# Patient Record
Sex: Female | Born: 1995 | Race: Black or African American | Hispanic: No | Marital: Single | Smoking: Never smoker
Health system: Southern US, Community
[De-identification: ages and names within clinical notes are randomized; demographics above are authoritative.]

---
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
## 2016-03-13 ENCOUNTER — Emergency Department (HOSPITAL_COMMUNITY)
Admission: EM | Admit: 2016-03-13 | Discharge: 2016-03-13 | Disposition: A | Discharge status: Home / Self Care | Payer: Self-pay | Attending: Emergency Medicine | Admitting: Emergency Medicine

## 2016-03-13 ENCOUNTER — Encounter (HOSPITAL_COMMUNITY): Payer: Self-pay | Admitting: Emergency Medicine

## 2016-03-13 DIAGNOSIS — B349 Viral infection, unspecified: Secondary | ICD-10-CM | POA: Insufficient documentation

## 2016-03-13 DIAGNOSIS — Z79899 Other long term (current) drug therapy: Secondary | ICD-10-CM | POA: Insufficient documentation

## 2016-03-13 LAB — RAPID STREP SCREEN (MED CTR MEBANE ONLY): Streptococcus, Group A Screen (Direct): NEGATIVE

## 2016-03-13 MED ORDER — OSELTAMIVIR PHOSPHATE 75 MG PO CAPS: 75.0000 mg | ORAL_CAPSULE | Freq: Two times a day (BID) | ORAL | 0 refills | Status: DC

## 2016-03-13 MED ORDER — BENZONATATE 100 MG PO CAPS: 200.0000 mg | ORAL_CAPSULE | Freq: Two times a day (BID) | ORAL | 0 refills | Status: DC | PRN

## 2016-03-13 MED ORDER — OXYMETAZOLINE HCL 0.05 % NA SOLN: 1.0000 | Freq: Two times a day (BID) | NASAL | 0 refills | Status: DC

## 2016-03-13 NOTE — ED Triage Notes (Signed)
Pt reports being at work and having body ache, chills, fever, and sore throat. Pt reports taking 2 Motrin at appx 2000 tonight.

## 2016-03-13 NOTE — Discharge Instructions (Signed)
Take your medications as prescribed. I also recommend taking Tylenol and ibuprofen as prescribed over-the-counter, alternating between doses every 3-4 hours to help with your chills, body aches and fever. Continue drinking fluids at home to remain hydrated. Please follow up with a primary care provider from the Resource Guide provided below in 3-4 days if your symptoms have not improved. Please return to the Emergency Department if symptoms worsen or new onset of headache, neck stiffness, facial swelling, unable to swallow resulting in drooling, unable to open her jaw completely, coughing up blood, difficulty breathing, chest pain, abdominal pain, vomiting, unable to keep fluids down.

## 2016-03-13 NOTE — ED Provider Notes (Signed)
[facility]EMER[facility]ENC[facility] DE[facility] Provider Note   CSN: [id number] Arrival date & time: 03/13/16  2101     History   Chief Complaint Chief Complaint  Patient presents with  . Sore Throat    HPI Erin Gray is a 21 y.o. female.  HPI   Patient is a 21 year old female who presents to the ED with complaint of chills, body ache, nasal congestion, sore throat and cough, onset this morning. Patient reports having gradually worsening symptoms throughout the day. 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 Patient reports subjective fever but denies taking her temperature. She states she took Motrin around 8 PM this evening but denies taking any other medications. Patient notes her father was sick with similar symptoms last week. Denies headache, lightheaded, ear pain, trismus, drooling, hemoptysis, shortness of breath, wheezing, chest pain, abdominal pain, nausea, vomiting, diarrhea, rash.  History reviewed. No pertinent past medical history.  There are no active problems to display for this patient.   History reviewed. No pertinent surgical history.  OB History    No data available       Home Medications    Prior to Admission medications   Medication Sig Start Date End Date Taking? Authorizing Provider  antipyrine-benzocaine Lyla Son(AURALGAN) otic solution Place 3 drops into the right ear every 2 (two) hours as needed for pain. 07/05/12   Hayden Rasmussenavid Mabe, NP  benzonatate (TESSALON) 100 MG capsule Take 2 capsules (200 mg total) by mouth 2 (two) times daily as needed for cough. 03/13/16   Barrett HenleNicole Elizabeth Francesca Strome, PA-C  oseltamivir (TAMIFLU) 75 MG capsule Take 1 capsule (75 mg total) by mouth every 12 (twelve) hours. 03/13/16   Barrett HenleNicole Elizabeth Adeola Dennen, PA-C  oxymetazoline (AFRIN NASAL SPRAY) 0.05 % nasal spray Place 1 spray into both nostrils 2 (two) times daily. Spray once into each nostril twice daily for up to the next 3 days. Do  not use for more than 3 days to prevent rebound rhinorrhea. 03/13/16   Barrett HenleNicole Elizabeth Ozzie Remmers, PA-C  Prenatal Vit-Fe Fumarate-FA (PRENATAL VITAMINS) 28-0.8 MG TABS 1 po q d 08/29/13   Hayden Rasmussenavid Mabe, NP    Family History History reviewed. No pertinent family history.  Social History Social History  Substance Use Topics  . Smoking status: Never Smoker  . Smokeless tobacco: Never Used  . Alcohol use No     Allergies   Patient has no known allergies.   Review of Systems Review of Systems  Constitutional: Positive for chills and fever (subjective).  HENT: Positive for congestion and sinus pain.   Respiratory: Positive for cough.   Musculoskeletal: Positive for myalgias (generalized).  All other systems reviewed and are negative.    Physical Exam Updated Vital Signs BP 122/76 (BP Location: Left Arm)   Pulse 115   Temp 99.9 F (37.7 C) (Oral)   Resp 18   Ht 5\' 4"  (1.626 m)   Wt 46.7 kg   LMP 02/23/2016   SpO2 96%   BMI 17.68 kg/m   Physical Exam  Constitutional: She is oriented to person, place, and time. She appears well-developed and well-nourished. No distress.  HENT:  Head: Normocephalic and atraumatic.  Right Ear: Tympanic membrane normal. No mastoid tenderness. No middle ear effusion.  Left Ear: Tympanic membrane normal. No mastoid tenderness.  No middle ear effusion.  Nose: Rhinorrhea present. Right sinus exhibits no maxillary sinus tenderness and no frontal sinus tenderness. Left sinus exhibits no  maxillary sinus tenderness and no frontal sinus tenderness.  Mouth/Throat: Uvula is midline, oropharynx is clear and moist and mucous membranes are normal. No oropharyngeal exudate, posterior oropharyngeal edema, posterior oropharyngeal erythema or tonsillar abscesses. No tonsillar exudate.  Eyes: Conjunctivae and EOM are normal. Right eye exhibits no discharge. Left eye exhibits no discharge. No scleral icterus.  Neck: Normal range of motion. Neck supple.  Cardiovascular:  Normal rate, regular rhythm, normal heart sounds and intact distal pulses.   HR 92  Pulmonary/Chest: Effort normal and breath sounds normal. No respiratory distress. She has no wheezes. She has no rales. She exhibits no tenderness.  Abdominal: Soft. She exhibits no distension.  Musculoskeletal: Normal range of motion. She exhibits no edema.  Lymphadenopathy:    She has no cervical adenopathy.  Neurological: She is alert and oriented to person, place, and time.  Skin: Skin is warm and dry. She is not diaphoretic.  Nursing note and vitals reviewed.    ED Treatments / Results  Labs (all labs ordered are listed, but only abnormal results are displayed) Labs Reviewed  RAPID STREP SCREEN (NOT AT [facility])  CULTURE, GROUP A STREP [facility])    EKG  EKG Interpretation None       Radiology No results found.  Procedures Procedures (including critical care time)  Medications Ordered in ED Medications - No data to display   Initial Impression / Assessment and Plan / ED Course  I have reviewed the triage vital signs and the nursing notes.  Pertinent labs & imaging results that were available during my care of the patient were reviewed by me and considered in my medical decision making (see chart for details).  Clinical Course    Patient presents with chills, body aches, sore throat, nasal congestion, cough that started this morning. Patient reports her father was sick with similar symptoms last week. VSS, temp 99.5, HR 92. Exam unremarkable. Lungs clear to auscultation bilaterally. Strep negative.  Patient with symptoms consistent with influenza vs viral illness.  No signs of dehydration, tolerating PO's. Due to patient's presentation and physical exam a chest x-ray was not ordered bc likely diagnosis of flu.  Discussed the cost versus benefit of Tamiflu treatment with the patient.   plan to discharge patient home with prescription of Tamiflu due to symptoms starting within the past 24  hours.  Patient will be discharged with instructions to orally hydrate, rest, and use over-the-counter medications such as anti-inflammatories ibuprofen and Aleve for muscle aches and Tylenol for fever.  Patient will also be given a cough suppressant and decongestant. Advised patient to follow up with PCP as needed. Discussed return precautions.   Final Clinical Impressions(s) / ED Diagnoses   Final diagnoses:  Viral illness    New Prescriptions New Prescriptions   BENZONATATE (TESSALON) 100 MG CAPSULE    Take 2 capsules (200 mg total) by mouth 2 (two) times daily as needed for cough.   OSELTAMIVIR (TAMIFLU) 75 MG CAPSULE    Take 1 capsule (75 mg total) by mouth every 12 (twelve) hours.   OXYMETAZOLINE (AFRIN NASAL SPRAY) 0.05 % NASAL SPRAY    Place 1 spray into both nostrils 2 (two) times daily. Spray once into each nostril twice daily for up to the next 3 days. Do not use for more than 3 days to prevent rebound rhinorrhea.          Satira Sark Brogan, [place] 03/13/16 2323    Tilden Fossa, MD 03/14/16 1538

## 2016-03-16 LAB — CULTURE, GROUP A STREP (THRC)

## 2016-09-15 ENCOUNTER — Ambulatory Visit (HOSPITAL_COMMUNITY)
Admission: EM | Admit: 2016-09-15 | Discharge: 2016-09-15 | Disposition: A | Discharge status: Home / Self Care | Payer: Self-pay | Attending: Internal Medicine | Admitting: Internal Medicine

## 2016-09-15 ENCOUNTER — Encounter (HOSPITAL_COMMUNITY): Payer: Self-pay | Admitting: Emergency Medicine

## 2016-09-15 DIAGNOSIS — R300 Dysuria: Secondary | ICD-10-CM

## 2016-09-15 DIAGNOSIS — N390 Urinary tract infection, site not specified: Secondary | ICD-10-CM

## 2016-09-15 MED ORDER — CEPHALEXIN 500 MG PO CAPS: 500.0000 mg | ORAL_CAPSULE | Freq: Four times a day (QID) | ORAL | 0 refills | Status: DC

## 2016-09-15 NOTE — ED Triage Notes (Signed)
[facility] a complaint of urinary frequency and dysuria x 2 weeks.

## 2016-09-15 NOTE — Discharge Instructions (Signed)
Drink plenty of fluids stay well-hydrated. Take the medication as directed. The medication for urinary tract pain is called Azo standard. Return to urine reddish orange and helps with discomfort of urination.

## 2016-09-15 NOTE — ED Provider Notes (Signed)
   CSN: [id number]     Arrival date & time 09/15/16  1725 History   First MD Initiated Contact with Patient 09/15/16 1826     Chief Complaint  Patient presents with  . Dysuria   (Consider location/radiation/quality/duration/timing/severity/associated sxs/prior Treatment) 21 year old female complaining of dysuria, urinary frequency and urgency for 2 weeks. Denies pelvic pain.      History reviewed. No pertinent past medical history. History reviewed. No pertinent surgical history. History reviewed. No pertinent family history. Social History  Substance Use Topics  . Smoking status: Never Smoker  . Smokeless tobacco: Never Used  . Alcohol use No   OB History    No data available     Review of Systems  Constitutional: Negative.   Respiratory: Negative.   Gastrointestinal: Negative.   Genitourinary: Negative for flank pain, menstrual problem and vaginal bleeding.       As per history of present illness  Skin: Negative.   All other systems reviewed and are negative.   Allergies  Patient has no known allergies.  Home Medications   Prior to Admission medications   Medication Sig Start Date End Date Taking? Authorizing Provider  cephALEXin (KEFLEX) 500 MG capsule Take 1 capsule (500 mg total) by mouth 4 (four) times daily. 09/15/16   Hayden RasmussenMabe, Kmari Brian, NP   Meds Ordered and Administered this Visit  Medications - No data to display  BP 117/83 (BP Location: Left Arm)   Pulse 94   Temp 98.9 F (37.2 C) (Oral)   Resp 16   LMP 09/04/2016   SpO2 100%  No data found.   Physical Exam  Constitutional: She is oriented to person, place, and time. She appears well-developed and well-nourished. No distress.  Eyes: EOM are normal.  Neck: Normal range of motion. Neck supple.  Cardiovascular: Normal rate.   Pulmonary/Chest: Effort normal. No respiratory distress.  Musculoskeletal: She exhibits no edema.  Neurological: She is alert and oriented to person, place, and time. She  exhibits normal muscle tone.  Skin: Skin is warm and dry.  Psychiatric: She has a normal mood and affect.  Nursing note and vitals reviewed.   Urgent Care Course     Procedures (including critical care time)  Labs Review Labs Reviewed - No data to display  Imaging Review No results found.   Visual Acuity Review  Right Eye Distance:   Left Eye Distance:   Bilateral Distance:    Right Eye Near:   Left Eye Near:    Bilateral Near:         MDM   1. Lower urinary tract infectious disease    Drink plenty of fluids stay well-hydrated. Take the medication as directed. The medication for urinary tract pain is called Azo standard. Return to urine reddish orange and helps with discomfort of urination. Meds ordered this encounter  Medications  . cephALEXin (KEFLEX) 500 MG capsule    Sig: Take 1 capsule (500 mg total) by mouth 4 (four) times daily.    Dispense:  28 capsule    Refill:  0    Order Specific Question:   Supervising Provider    Answer:   Eustace MooreMURRAY, LAURA W [[id number]]       Hayden RasmussenMabe, Marcille Barman, NP 09/15/16 1849

## 2016-09-27 ENCOUNTER — Encounter (HOSPITAL_COMMUNITY): Payer: Self-pay | Admitting: Emergency Medicine

## 2016-09-27 ENCOUNTER — Emergency Department (HOSPITAL_COMMUNITY)
Admission: EM | Admit: 2016-09-27 | Discharge: 2016-09-27 | Disposition: A | Discharge status: Home / Self Care | Payer: Self-pay | Attending: Physician Assistant | Admitting: Physician Assistant

## 2016-09-27 DIAGNOSIS — B009 Herpesviral infection, unspecified: Secondary | ICD-10-CM | POA: Insufficient documentation

## 2016-09-27 DIAGNOSIS — R300 Dysuria: Secondary | ICD-10-CM | POA: Insufficient documentation

## 2016-09-27 DIAGNOSIS — A599 Trichomoniasis, unspecified: Secondary | ICD-10-CM | POA: Insufficient documentation

## 2016-09-27 DIAGNOSIS — Z202 Contact with and (suspected) exposure to infections with a predominantly sexual mode of transmission: Secondary | ICD-10-CM

## 2016-09-27 LAB — URINALYSIS, ROUTINE W REFLEX MICROSCOPIC
BILIRUBIN URINE: NEGATIVE
GLUCOSE, UA: NEGATIVE mg/dL
Ketones, ur: NEGATIVE mg/dL
NITRITE: NEGATIVE
PROTEIN: NEGATIVE mg/dL
Specific Gravity, Urine: 1.005 (ref 1.005–1.030)
pH: 7 (ref 5.0–8.0)

## 2016-09-27 LAB — WET PREP, GENITAL
CLUE CELLS WET PREP: NONE SEEN
Sperm: NONE SEEN
Yeast Wet Prep HPF POC: NONE SEEN

## 2016-09-27 LAB — PREGNANCY, URINE: PREG TEST UR: NEGATIVE

## 2016-09-27 MED ORDER — AZITHROMYCIN 250 MG PO TABS
1000.0000 mg | ORAL_TABLET | Freq: Once | ORAL | Status: AC
  Administered 2016-09-27: 1000 mg via ORAL
  Filled 2016-09-27: qty 4

## 2016-09-27 MED ORDER — METRONIDAZOLE 500 MG PO TABS: 500.0000 mg | ORAL_TABLET | Freq: Two times a day (BID) | ORAL | 0 refills | Status: DC

## 2016-09-27 MED ORDER — ONDANSETRON HCL 4 MG PO TABS
4.0000 mg | ORAL_TABLET | Freq: Once | ORAL | Status: AC
  Administered 2016-09-27: 4 mg via ORAL
  Filled 2016-09-27: qty 1

## 2016-09-27 MED ORDER — METRONIDAZOLE 500 MG PO TABS
500.0000 mg | ORAL_TABLET | Freq: Once | ORAL | Status: AC
  Administered 2016-09-27: 500 mg via ORAL
  Filled 2016-09-27: qty 1

## 2016-09-27 MED ORDER — LIDOCAINE HCL (PF) 1 % IJ SOLN
INTRAMUSCULAR | Status: AC
  Administered 2016-09-27: 5 mL
  Filled 2016-09-27: qty 5

## 2016-09-27 MED ORDER — ACYCLOVIR 400 MG PO TABS: 400.0000 mg | ORAL_TABLET | Freq: Four times a day (QID) | ORAL | 0 refills | Status: DC

## 2016-09-27 MED ORDER — CEFTRIAXONE SODIUM 250 MG IJ SOLR
250.0000 mg | Freq: Once | INTRAMUSCULAR | Status: AC
  Administered 2016-09-27: 250 mg via INTRAMUSCULAR
  Filled 2016-09-27: qty 250

## 2016-09-27 NOTE — ED Triage Notes (Addendum)
Has red bumps on her  Bottom and it hurts to pee x 1 week , had been see for UTI recently

## 2016-09-27 NOTE — ED Provider Notes (Signed)
[facility]-EMERGENCY DEPT Provider Note   CSN: [id number] Arrival date & time: 09/27/16  1531   By signing my name below, I, Clarisse Gouge, attest that this documentation has been prepared under the direction and in the presence of Somya Jauregui, Cindee Salt, MD. Electronically signed, Clarisse Gouge, ED Scribe. 09/27/16. 5:47 PM.  History   Chief Complaint Chief Complaint  Patient presents with  . Exposure to STD  . Dysuria   The history is provided by the patient and medical records. No language interpreter was used.    Erin Gray is a 21 y.o. female presenting to the Emergency Department concerning external vaginal pain when urinating x 1 week. She also notes red, raised painful genital sores, stating some have bled when the pt wiped at home. Associated urinary urgency and frequency. 8/10, constant, throbbing vaginal pain described. Pt treated for UTI in [facility] on 09/15/2016 with keflex; she states this brought relief but her symptoms returned. No recent trauma or injury. No fever or N/V/D. No other complaints at this time.   History reviewed. No pertinent past medical history.  There are no active problems to display for this patient.   History reviewed. No pertinent surgical history.  OB History    No data available       Home Medications    Prior to Admission medications   Medication Sig Start Date End Date Taking? Authorizing Provider  cephALEXin (KEFLEX) 500 MG capsule Take 1 capsule (500 mg total) by mouth 4 (four) times daily. 09/15/16   Hayden Rasmussen, NP    Family History No family history on file.  Social History Social History  Substance Use Topics  . Smoking status: Never Smoker  . Smokeless tobacco: Never Used  . Alcohol use No     Allergies   Patient has no known allergies.   Review of Systems Review of Systems  Constitutional: Negative for fever.  Gastrointestinal: Negative for nausea and vomiting.  Genitourinary: Positive for dysuria, frequency, genital  sores, urgency and vaginal pain. Negative for difficulty urinating.  Skin: Positive for wound.  All other systems reviewed and are negative.    Physical Exam Updated Vital Signs BP (!) 97/59   Pulse 91   Temp 98.9 F (37.2 C) (Oral)   Resp 18   LMP 09/04/2016   SpO2 99%   Physical Exam  Constitutional: She is oriented to person, place, and time. She appears well-developed and well-nourished.  HENT:  Head: Normocephalic.  Eyes: Pupils are equal, round, and reactive to light. Conjunctivae and EOM are normal.  Neck: Normal range of motion.  Cardiovascular: Normal rate, regular rhythm and normal heart sounds.  Exam reveals no gallop and no friction rub.   No murmur heard. Pulmonary/Chest: Effort normal. No respiratory distress. She has no wheezes.  Abdominal: She exhibits no distension. There is no tenderness.  Genitourinary: There is lesion on the right labia. There is lesion on the left labia.  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 She exhibits no tenderness.  Neurological: She is alert and oriented to person, place, and time.  Skin: Skin is warm. No erythema.  Psychiatric: She has a normal mood and affect.  Nursing note and vitals reviewed.    ED Treatments / Results  DIAGNOSTIC STUDIES: Oxygen Saturation is 99% on RA, NL by my interpretation.    COORDINATION OF CARE: 5:44 PM-Discussed next steps with pt. Pt verbalized understanding and is agreeable with the  plan. Will Rx medications.   Labs (all labs ordered are listed, but only abnormal results are displayed) Labs Reviewed  URINALYSIS, ROUTINE W REFLEX MICROSCOPIC    EKG  EKG Interpretation None       Radiology No results found.  Procedures Procedures (including critical care time)  Medications Ordered in ED Medications - No data to display   Initial Impression / Assessment and Plan / ED Course  I have reviewed the  triage vital signs and the nursing notes.  Pertinent labs & imaging results that were available during my care of the patient were reviewed by me and considered in my medical decision making (see chart for details).     I personally performed the services described in this documentation, which was scribed in my presence. The recorded information has been reviewed and is accurate.   Patient presenting because she is concerned about lesions on the external part of her vagina. These seem consistent with HSV. Additionally patient has noticed discharge. And would like to be treated for STDs. Patient has trich noted on wet prep.  Patient had UTI recently treated as an outpatient. However she felt like her symptoms went away for a little bit and then got worse. I suspect the symptoms may actually been from her vaginal infection rather than urinary. No CMT.  We'll treat for gonorrhea, chlamydia, trichomoniasis, and HSV.     Final Clinical Impressions(s) / ED Diagnoses   Final diagnoses:  None    New Prescriptions New Prescriptions   No medications on file       Abelino DerrickMackuen, Shayra Anton Lyn, MD 09/27/16 [phone]

## 2016-09-28 LAB — GC/CHLAMYDIA PROBE AMP (~~LOC~~) NOT AT ARMC
CHLAMYDIA, DNA PROBE: NEGATIVE
NEISSERIA GONORRHEA: NEGATIVE

## 2018-05-24 ENCOUNTER — Ambulatory Visit (HOSPITAL_COMMUNITY)
Admission: EM | Admit: 2018-05-24 | Discharge: 2018-05-24 | Disposition: A | Discharge status: Home / Self Care | Payer: Self-pay | Attending: Family Medicine | Admitting: Family Medicine

## 2018-05-24 ENCOUNTER — Other Ambulatory Visit: Payer: Self-pay

## 2018-05-24 ENCOUNTER — Encounter (HOSPITAL_COMMUNITY): Payer: Self-pay

## 2018-05-24 DIAGNOSIS — N898 Other specified noninflammatory disorders of vagina: Secondary | ICD-10-CM | POA: Insufficient documentation

## 2018-05-24 MED ORDER — FLUCONAZOLE 150 MG PO TABS: 150.0000 mg | ORAL_TABLET | Freq: Every day | ORAL | 0 refills | Status: AC

## 2018-05-24 MED ORDER — METRONIDAZOLE 500 MG PO TABS: 500.0000 mg | ORAL_TABLET | Freq: Two times a day (BID) | ORAL | 0 refills | Status: DC

## 2018-05-24 NOTE — Discharge Instructions (Signed)
You were treated empirically for bacterial vaginitis. Start flagyl as directed. As discussed, I also called in diflucan, if experiencing vaginal itching, cottage cheese like discharge, can take to cover for yeast infection. Cytology sent, you will be contacted with any positive results that requires further treatment. Refrain from sexual activity and alcohol use for the next 7 days. Monitor for any worsening of symptoms, fever, abdominal pain, nausea, vomiting, to follow up for reevaluation.

## 2018-05-24 NOTE — ED Notes (Signed)
Self swab placed in lab

## 2018-05-24 NOTE — ED Provider Notes (Signed)
[facility]    CSN: [id number] Arrival date & time: 05/24/18  1117     History   Chief Complaint Chief Complaint  Patient presents with  . Vaginal Discharge    HPI Erin Gray is a 23 y.o. female.   23 year old female comes in for 1 week history of vaginal discharge with fishy odor.  She denies vaginal itching, vaginal pain.  Denies open sores.  Denies abdominal pain, nausea, vomiting.  Denies fever, chills, night sweats.  Denies urinary symptoms such as frequency, dysuria, hematuria. Sexually active with one female partner, occasional condom use. LMP 05/04/2018.  Patient has had HSV outbreak once. Did not know if she should be on medicien daily. Denies vaginal pain, open sores at this time.      History reviewed. No pertinent past medical history.  There are no active problems to display for this patient.   History reviewed. No pertinent surgical history.  OB History   No obstetric history on file.      Home Medications    Prior to Admission medications   Medication Sig Start Date End Date Taking? Authorizing Provider  fluconazole (DIFLUCAN) 150 MG tablet Take 1 tablet (150 mg total) by mouth daily. Take second dose 72 hours later if symptoms still persists. 05/24/18   Cathie Hoops, Amy V, PA-C  metroNIDAZOLE (FLAGYL) 500 MG tablet Take 1 tablet (500 mg total) by mouth 2 (two) times daily. 05/24/18   Belinda Fisher, PA-C    Family History History reviewed. No pertinent family history.  Social History Social History   Tobacco Use  . Smoking status: Never Smoker  . Smokeless tobacco: Never Used  Substance Use Topics  . Alcohol use: No  . Drug use: No     Allergies   Patient has no known allergies.   Review of Systems Review of Systems  Reason unable to perform ROS: See HPI as above.     Physical Exam Triage Vital Signs ED Triage Vitals  Enc Vitals Group     BP 05/24/18 1154 111/72     Pulse Rate 05/24/18 1154 84     Resp 05/24/18 1154 18     Temp  05/24/18 1154 98.2 F (36.8 C)     Temp Source 05/24/18 1154 Oral     SpO2 05/24/18 1154 100 %     Weight 05/24/18 1150 101 lb (45.8 kg)     Height --      Head Circumference --      Peak Flow --      Pain Score 05/24/18 1150 1     Pain Loc --      Pain Edu? --      Excl. in GC? --    No data found.  Updated Vital Signs BP 111/72 (BP Location: Right Arm)   Pulse 84   Temp 98.2 F (36.8 C) (Oral)   Resp 18   Wt 101 lb (45.8 kg)   SpO2 100%   BMI 17.34 kg/m   Visual Acuity Right Eye Distance:   Left Eye Distance:   Bilateral Distance:    Right Eye Near:   Left Eye Near:    Bilateral Near:     Physical Exam Constitutional:      General: She is not in acute distress.    Appearance: She is well-developed. She is not ill-appearing, toxic-appearing or diaphoretic.  HENT:     Head: Normocephalic and atraumatic.  Eyes:     Conjunctiva/sclera: Conjunctivae normal.  Pupils: Pupils are equal, round, and reactive to light.  Cardiovascular:     Rate and Rhythm: Normal rate and regular rhythm.     Heart sounds: Normal heart sounds. No murmur. No friction rub. No gallop.   Pulmonary:     Effort: Pulmonary effort is normal.     Breath sounds: Normal breath sounds. No wheezing or rales.  Abdominal:     General: Bowel sounds are normal.     Palpations: Abdomen is soft.     Tenderness: There is no abdominal tenderness. There is no right CVA tenderness, left CVA tenderness, guarding or rebound.  Skin:    General: Skin is warm and dry.  Neurological:     Mental Status: She is alert and oriented to person, place, and time.  Psychiatric:        Behavior: Behavior normal.        Judgment: Judgment normal.      UC Treatments / Results  Labs (all labs ordered are listed, but only abnormal results are displayed) Labs Reviewed  CERVICOVAGINAL ANCILLARY ONLY    EKG None  Radiology No results found.  Procedures Procedures (including critical care time)   Medications Ordered in UC Medications - No data to display  Initial Impression / Assessment and Plan / UC Course  I have reviewed the triage vital signs and the nursing notes.  Pertinent labs & imaging results that were available during my care of the patient were reviewed by me and considered in my medical decision making (see chart for details).    Will cover for BV with flagyl. Diflucan also called in in case of yeast. Cytology sent. Return precautions given. Patient expresses understanding and agrees to plan.  Final Clinical Impressions(s) / UC Diagnoses   Final diagnoses:  Vaginal discharge   ED Prescriptions    Medication Sig Dispense Auth. Provider   metroNIDAZOLE (FLAGYL) 500 MG tablet Take 1 tablet (500 mg total) by mouth 2 (two) times daily. 14 tablet Yu, Amy V, PA-C   fluconazole (DIFLUCAN) 150 MG tablet Take 1 tablet (150 mg total) by mouth daily. Take second dose 72 hours later if symptoms still persists. 2 tablet Threasa Alpha, PA-C 05/24/18 1238

## 2018-05-24 NOTE — ED Triage Notes (Signed)
Pt cc vaginal discharge with a odor x 1 week or more. Pt needs prescription for her herpes.

## 2018-05-25 LAB — CERVICOVAGINAL ANCILLARY ONLY
Chlamydia: POSITIVE — AB
NEISSERIA GONORRHEA: POSITIVE — AB
Trichomonas: POSITIVE — AB

## 2018-05-26 ENCOUNTER — Telehealth (HOSPITAL_COMMUNITY): Payer: Self-pay | Admitting: Emergency Medicine

## 2018-05-26 NOTE — Telephone Encounter (Signed)
Chlamydia is positive.  This was not treated. Pt needs education to please refrain from sexual intercourse for 7 days to give the medicine time to work, sexual partners need to be notified and tested/treated.  Condoms may reduce risk of reinfection.  Recheck or followup with PCP for further evaluation if symptoms are not improving.   [facility] notified.  Gonorrhea is positive.  Patient should return as soon as possible to the urgent care for treatment with IM rocephin 250mg  and po zithromax 1g. Patient will not need to see a provider unless there are new symptoms she would like evaluated. Pt needs education to refrain from sexual intercourse for now and for 7 days after treatment to give the medicine time to work. Sexual partners need to be notified and tested/treated. Condoms may reduce risk of reinfection. [facility] notified.   Trichomonas is positive. Rx metronidazole was given at the urgent care visit. Pt needs education to please refrain from sexual intercourse for 7 days to give the medicine time to work. Sexual partners need to be notified and tested/treated. Condoms may reduce risk of reinfection. Recheck for further evaluation if symptoms are not improving.   Attempted to reach patient. No answer at this time. Mailbox full

## 2018-05-29 ENCOUNTER — Ambulatory Visit (HOSPITAL_COMMUNITY)
Admission: EM | Admit: 2018-05-29 | Discharge: 2018-05-29 | Disposition: A | Discharge status: Home / Self Care | Payer: Self-pay | Attending: Physician Assistant | Admitting: Physician Assistant

## 2018-05-29 ENCOUNTER — Telehealth (HOSPITAL_COMMUNITY): Payer: Self-pay | Admitting: Emergency Medicine

## 2018-05-29 ENCOUNTER — Other Ambulatory Visit: Payer: Self-pay

## 2018-05-29 ENCOUNTER — Encounter (HOSPITAL_COMMUNITY): Payer: Self-pay

## 2018-05-29 DIAGNOSIS — A749 Chlamydial infection, unspecified: Secondary | ICD-10-CM

## 2018-05-29 DIAGNOSIS — A599 Trichomoniasis, unspecified: Secondary | ICD-10-CM

## 2018-05-29 DIAGNOSIS — A549 Gonococcal infection, unspecified: Secondary | ICD-10-CM

## 2018-05-29 MED ORDER — CEFTRIAXONE SODIUM 250 MG IJ SOLR
250.0000 mg | Freq: Once | INTRAMUSCULAR | Status: AC
  Administered 2018-05-29: 250 mg via INTRAMUSCULAR

## 2018-05-29 MED ORDER — AZITHROMYCIN 250 MG PO TABS
ORAL_TABLET | ORAL | Status: AC
  Filled 2018-05-29: qty 1

## 2018-05-29 MED ORDER — CEFTRIAXONE SODIUM 250 MG IJ SOLR
INTRAMUSCULAR | Status: AC
  Filled 2018-05-29: qty 250

## 2018-05-29 MED ORDER — AZITHROMYCIN 250 MG PO TABS
1000.0000 mg | ORAL_TABLET | Freq: Once | ORAL | Status: AC
  Administered 2018-05-29: 1000 mg via ORAL

## 2018-05-29 NOTE — Telephone Encounter (Signed)
Spoke with pt was agreeable to coming back in for treatment today or tomorrow

## 2018-05-29 NOTE — ED Triage Notes (Signed)
Pt got a call to come in and be treated for a STD today. Pt got a call from a nurse here in the office.

## 2018-08-15 ENCOUNTER — Emergency Department (HOSPITAL_COMMUNITY): Payer: Self-pay

## 2018-08-15 ENCOUNTER — Encounter (HOSPITAL_COMMUNITY): Payer: Self-pay | Admitting: Emergency Medicine

## 2018-08-15 ENCOUNTER — Other Ambulatory Visit: Payer: Self-pay

## 2018-08-15 ENCOUNTER — Emergency Department (HOSPITAL_COMMUNITY)
Admission: EM | Admit: 2018-08-15 | Discharge: 2018-08-16 | Disposition: A | Discharge status: Home / Self Care | Payer: Self-pay | Attending: Emergency Medicine | Admitting: Emergency Medicine

## 2018-08-15 DIAGNOSIS — W19XXXA Unspecified fall, initial encounter: Secondary | ICD-10-CM | POA: Insufficient documentation

## 2018-08-15 DIAGNOSIS — Y939 Activity, unspecified: Secondary | ICD-10-CM | POA: Insufficient documentation

## 2018-08-15 DIAGNOSIS — R600 Localized edema: Secondary | ICD-10-CM | POA: Insufficient documentation

## 2018-08-15 DIAGNOSIS — M79642 Pain in left hand: Secondary | ICD-10-CM | POA: Insufficient documentation

## 2018-08-15 DIAGNOSIS — Z79899 Other long term (current) drug therapy: Secondary | ICD-10-CM | POA: Insufficient documentation

## 2018-08-15 DIAGNOSIS — Y92832 Beach as the place of occurrence of the external cause: Secondary | ICD-10-CM | POA: Insufficient documentation

## 2018-08-15 DIAGNOSIS — Y999 Unspecified external cause status: Secondary | ICD-10-CM | POA: Insufficient documentation

## 2018-08-15 NOTE — ED Triage Notes (Signed)
Patient c/o left thumb pain and swelling after fall x4 days ago. Reports hx of break to same.

## 2018-08-15 NOTE — ED Notes (Signed)
Bed: WA03 Expected date:  Expected time:  Means of arrival:  Comments: Closing section 

## 2018-08-16 NOTE — Discharge Instructions (Signed)
You may alternate taking Tylenol and Ibuprofen as needed for pain control. You may take 400-600 mg of ibuprofen every 6 hours and [phone] mg of Tylenol every 6 hours. Do not exceed 4000 mg of Tylenol daily as this can lead to liver damage. Also, make sure to take Ibuprofen with meals as it can cause an upset stomach. Do not take other NSAIDs while taking Ibuprofen such as (Aleve, Naprosyn, Aspirin, Celebrex, etc) and do not take more than the prescribed dose as this can lead to ulcers and bleeding in your GI tract. You may use warm and cold compresses to help with your symptoms.   Please follow up with your primary doctor or orthopedics within the next 7-10 days for re-evaluation and further treatment of your symptoms.   Please return to the ER sooner if you have any new or worsening symptoms.

## 2018-08-16 NOTE — ED Provider Notes (Signed)
[facility] DEPT Provider Note   CSN: [id number] Arrival date & time: 08/15/18  2207    History   Chief Complaint Chief Complaint  Patient presents with  . Hand Pain    HPI Erin Gray is a 23 y.o. female.     HPI   Patient is a 23 year old female who presents emergency department today complaining of left hand pain.  [place] and now has pain to the left thumb with some associated swelling.  Denies any problems with range of motion.  No numbness.  No other injuries.  No pain in the wrist.  History reviewed. No pertinent past medical history.  There are no active problems to display for this patient.   History reviewed. No pertinent surgical history.   OB History   No obstetric history on file.    Home Medications    Prior to Admission medications   Medication Sig Start Date End Date Taking? Authorizing Provider  fluconazole (DIFLUCAN) 150 MG tablet Take 1 tablet (150 mg total) by mouth daily. Take second dose 72 hours later if symptoms still persists. 05/24/18   Tasia Catchings, Amy V, PA-C  metroNIDAZOLE (FLAGYL) 500 MG tablet Take 1 tablet (500 mg total) by mouth 2 (two) times daily. 05/24/18   Ok Edwards, PA-C    Family History No family history on file.  Social History Social History   Tobacco Use  . Smoking status: Never Smoker  . Smokeless tobacco: Never Used  Substance Use Topics  . Alcohol use: No  . Drug use: No     Allergies   Patient has no known allergies.   Review of Systems Review of Systems  Musculoskeletal:       Left thumb pain  Skin: Negative for color change.  Neurological: Negative for weakness and numbness.       No head trauma or loc     Physical Exam Updated Vital Signs BP 120/76 (BP Location: Right Arm)   Pulse 63   Temp 99.3 F (37.4 C) (Oral)   Resp 16   Ht 5\' 4"  (1.626 m)   Wt 45.8 kg   LMP 08/15/2018   SpO2 98%   BMI 17.34 kg/m   Physical Exam Vitals signs and  nursing note reviewed.  Constitutional:      General: She is not in acute distress.    Appearance: She is well-developed.  HENT:     Head: Normocephalic and atraumatic.  Eyes:     Conjunctiva/sclera: Conjunctivae normal.  Neck:     Musculoskeletal: Neck supple.  Cardiovascular:     Rate and Rhythm: Normal rate.  Pulmonary:     Effort: Pulmonary effort is normal.  Musculoskeletal: Normal range of motion.     Comments: Mild tenderness to the base of the left thumb.  No erythema or swelling noted.  No tenderness to the anatomical snuffbox.  No tenderness throughout the wrist.  Full range of motion of the hand and wrist.  Normal sensation.  Skin:    General: Skin is warm and dry.  Neurological:     Mental Status: She is alert.      ED Treatments / Results  Labs (all labs ordered are listed, but only abnormal results are displayed) Labs Reviewed - No data to display  EKG None  Radiology Dg Hand Complete Left  Result Date: 08/15/2018 CLINICAL DATA:  Fall EXAM: LEFT HAND - COMPLETE 3+ VIEW COMPARISON:  None. FINDINGS: There  is no evidence of fracture or dislocation. There is no evidence of arthropathy or other focal bone abnormality. Soft tissues are unremarkable. IMPRESSION: Negative. Electronically Signed   By: Jasmine PangKim  Fujinaga M.D.   On: 08/15/2018 23:16    Procedures Procedures (including critical care time) SPLINT APPLICATION Date/Time: 12:11 AM Authorized by: Karrie Meresortni S Carlea Badour Consent: Verbal consent obtained. Risks and benefits: risks, benefits and alternatives were discussed Consent given by: patient Splint applied by: orthopedic technician Location details: wrist Splint type: velcro wrist splint Post-procedure: The splinted body part was neurovascularly unchanged following the procedure. Patient tolerance: Patient tolerated the procedure well with no immediate complications.   Medications Ordered in ED Medications - No data to display   Initial Impression /  Assessment and Plan / ED Course  I have reviewed the triage vital signs and the nursing notes.  Pertinent labs & imaging results that were available during my care of the patient were reviewed by me and considered in my medical decision making (see chart for details).   Final Clinical Impressions(s) / ED Diagnoses   Final diagnoses:  Left hand pain  Fall, initial encounter   23 year old female presenting for evaluation after fall that occurred 4 days ago.  Complaining of left thumb pain.  Minimal tenderness on exam.  No erythema.  No anatomical snuffbox tenderness.  X-ray of the left hand is negative for acute fracture.  Will give splint for comfort and have her follow-up with Ortho if symptoms continue.  Advised return if worse.  She voiced understanding the plan and reasons to return.  All questions answered.  Patient stable for discharge.  ED Discharge Orders    None       Rayne DuCouture, Dannya Pitkin S, PA-C 08/16/18 0011    Arby BarrettePfeiffer, Marcy, MD 08/19/18 1441

## 2018-11-07 ENCOUNTER — Emergency Department (HOSPITAL_COMMUNITY)
Admission: EM | Admit: 2018-11-07 | Discharge: 2018-11-07 | Disposition: A | Discharge status: Home / Self Care | Payer: Self-pay | Attending: Emergency Medicine | Admitting: Emergency Medicine

## 2018-11-07 ENCOUNTER — Emergency Department (HOSPITAL_COMMUNITY): Payer: Self-pay

## 2018-11-07 ENCOUNTER — Other Ambulatory Visit: Payer: Self-pay

## 2018-11-07 ENCOUNTER — Encounter (HOSPITAL_COMMUNITY): Payer: Self-pay | Admitting: Emergency Medicine

## 2018-11-07 DIAGNOSIS — Y929 Unspecified place or not applicable: Secondary | ICD-10-CM | POA: Insufficient documentation

## 2018-11-07 DIAGNOSIS — W010XXA Fall on same level from slipping, tripping and stumbling without subsequent striking against object, initial encounter: Secondary | ICD-10-CM | POA: Insufficient documentation

## 2018-11-07 DIAGNOSIS — Y939 Activity, unspecified: Secondary | ICD-10-CM | POA: Insufficient documentation

## 2018-11-07 DIAGNOSIS — Y999 Unspecified external cause status: Secondary | ICD-10-CM | POA: Insufficient documentation

## 2018-11-07 DIAGNOSIS — S92422A Displaced fracture of distal phalanx of left great toe, initial encounter for closed fracture: Secondary | ICD-10-CM | POA: Insufficient documentation

## 2018-11-07 NOTE — ED Notes (Signed)
Pt dscyussed dc instructions abnd verbalizes un derstanding. Hone stable with crutches via wc with mother.

## 2018-11-07 NOTE — Discharge Instructions (Signed)
There is evidence of a fracture on the x-ray. Pain:  Antiinflammatory medications: Take 600 mg of ibuprofen every 6 hours or 440 mg (over the counter dose) to 500 mg (prescription dose) of naproxen every 12 hours for the next 3 days. After this time, these medications may be used as needed for pain. Take these medications with food to avoid upset stomach. Choose only one of these medications, do not take them together. Acetaminophen (generic for Tylenol): Should you continue to have additional pain while taking the ibuprofen or naproxen, you may add in acetaminophen as needed. Your daily total maximum amount of acetaminophen from all sources should be limited to 4000mg /day for persons without liver problems, or 2000mg /day for those with liver problems. Ice: May apply ice to the injured area for no more than 15 minutes at a time to reduce swelling and pain. Elevation: Keep the extremity elevated whenever possible to reduce swelling and pain. Shoe: Keep the postop shoe clean and dry.  You will be weightbearing as tolerated. Follow-up: Follow-up with the orthopedic specialist for any further management of this issue.  Call the number provided to set up an appointment. Return: Return to the emergency department for severely increased pain, numbness, blanching of the skin, or any other major concerns.

## 2018-11-07 NOTE — ED Provider Notes (Signed)
[facility]   CSN: [id number] Arrival date & time: 11/07/18  0419     History   Chief Complaint Chief Complaint  Patient presents with  . Toe Injury    HPI Alden BenjaminMiah Aurther Lofterry is a 23 y.o. female.     HPI   Alden BenjaminMiah Aurther Lofterry is a 23 y.o. female, patient with no pertinent past medical history, presenting to the ED with left great toe injury that occurred shortly prior to arrival. States she was walking in the rain in crocs, slipped, tripped over her crocs, and fell backward.  She states, "I trip over my crocs all the time." Complains of left great toe pain around the IP joint, moderate, throbbing, nonradiating. Denies head injury, neck/back pain, LOC, chest pain, shortness of breath, abdominal pain, pelvic pain, numbness, weakness, nausea/vomiting, or any other complaints.   History reviewed. No pertinent past medical history.  There are no active problems to display for this patient.   History reviewed. No pertinent surgical history.   OB History   No obstetric history on file.      Home Medications    Prior to Admission medications   Medication Sig Start Date End Date Taking? Authorizing Provider  fluconazole (DIFLUCAN) 150 MG tablet Take 1 tablet (150 mg total) by mouth daily. Take second dose 72 hours later if symptoms still persists. 05/24/18   Cathie HoopsYu, Amy V, PA-C  metroNIDAZOLE (FLAGYL) 500 MG tablet Take 1 tablet (500 mg total) by mouth 2 (two) times daily. 05/24/18   Belinda FisherYu, Amy V, PA-C    Family History No family history on file.  Social History Social History   Tobacco Use  . Smoking status: Never Smoker  . Smokeless tobacco: Never Used  Substance Use Topics  . Alcohol use: No  . Drug use: No     Allergies   Patient has no known allergies.   Review of Systems Review of Systems  Constitutional: Negative for diaphoresis.  Respiratory: Negative for shortness of breath.   Cardiovascular: Negative for chest pain.   Gastrointestinal: Negative for abdominal pain, nausea and vomiting.  Musculoskeletal: Positive for arthralgias. Negative for back pain and neck pain.  Neurological: Negative for dizziness, syncope, weakness, light-headedness, numbness and headaches.  All other systems reviewed and are negative.    Physical Exam Updated Vital Signs BP 108/74   Pulse 82   Temp 98.8 F (37.1 C) (Oral)   Resp 17   Ht 5\' 2"  (1.575 m)   Wt 43.1 kg   SpO2 97%   BMI 17.38 kg/m   Physical Exam Vitals signs and nursing note reviewed.  Constitutional:      General: She is not in acute distress.    Appearance: She is well-developed. She is not diaphoretic.  HENT:     Head: Normocephalic and atraumatic.     Mouth/Throat:     Mouth: Mucous membranes are moist.     Pharynx: Oropharynx is clear.  Eyes:     Conjunctiva/sclera: Conjunctivae normal.  Neck:     Musculoskeletal: Normal range of motion and neck supple. No muscular tenderness.  Cardiovascular:     Rate and Rhythm: Normal rate and regular rhythm.     Pulses: Normal pulses.          Radial pulses are 2+ on the right side and 2+ on the left side.       Posterior tibial pulses are 2+ on the right side and 2+ on the left side.  Comments: Tactile temperature in the extremities appropriate and equal bilaterally. Pulmonary:     Effort: Pulmonary effort is normal. No respiratory distress.  Abdominal:     Tenderness: There is no guarding.  Musculoskeletal:        General: Tenderness present.     Right lower leg: No edema.     Left lower leg: No edema.     Comments: Tenderness to the left great toe around the IP joint without noted swelling, deformity, color change, or instability.  Normal motor function intact in all extremities. No midline spinal tenderness.   Skin:    General: Skin is warm and dry.     Capillary Refill: Capillary refill takes less than 2 seconds.  Neurological:     Mental Status: She is alert and oriented to person,  place, and time.     Comments: Sensation grossly intact to light touch in the extremities, specifically present in the left great toe.  Grip strengths equal bilaterally.  Strength 5/5 in all extremities.  Appropriate motor function in the left great toe.  Coordination intact. Cranial nerves III-XII grossly intact. No facial droop.   Psychiatric:        Mood and Affect: Mood and affect normal.        Speech: Speech normal.        Behavior: Behavior normal.      ED Treatments / Results  Labs (all labs ordered are listed, but only abnormal results are displayed) Labs Reviewed - No data to display  EKG None  Radiology Dg Toe Great Left  Result Date: 11/07/2018 CLINICAL DATA:  Fall, struck great toe EXAM: LEFT GREAT TOE COMPARISON:  None. FINDINGS: Minimally displaced fracture through the base of the first distal phalanx with intra-articular extension to the interphalangeal joint. Mild associated soft tissue swelling. No other acute fracture or traumatic malalignment. Remaining osseous structures are unremarkable. IMPRESSION: Minimally displaced intra-articular fracture through the base of the first distal phalanx. Mild swelling. Electronically Signed   By: Kreg Shropshire M.D.   On: 11/07/2018 05:03    Procedures .Splint Application  Date/Time: 11/07/2018 7:25 AM Performed by: Anselm Pancoast, PA-C Authorized by: Anselm Pancoast, PA-C   Consent:    Consent obtained:  Verbal   Consent given by:  Patient   Risks discussed:  Discoloration, numbness, pain and swelling Pre-procedure details:    Sensation:  Normal   Skin color:  Normal Procedure details:    Laterality:  Left   Location:  Toe   Toe:  L big toe   Splint type: Postop shoe.   Supplies:  Prefabricated splint Post-procedure details:    Pain:  Improved   Sensation:  Normal   Skin color:  Normal   Patient tolerance of procedure:  Tolerated well, no immediate complications Comments:     Procedure was performed by the Ortho Tech  with my evaluation before and after. I was available for consultation throughout the procedure.   (including critical care time)  Medications Ordered in ED Medications - No data to display   Initial Impression / Assessment and Plan / ED Course  I have reviewed the triage vital signs and the nursing notes.  Pertinent labs & imaging results that were available during my care of the patient were reviewed by me and considered in my medical decision making (see chart for details).        Patient presents following a fall.  Her only complaint is left great toe pain.  She  does have a mildly displaced intra-articular fracture through the base of the first distal phalanx.  No evidence of neurovascular compromise no evidence of other injury requiring imaging or further management. Orthopedic follow-up. The patient was given instructions for home care as well as return precautions. Patient voices understanding of these instructions, accepts the plan, and is comfortable with discharge.   Final Clinical Impressions(s) / ED Diagnoses   Final diagnoses:  Closed displaced fracture of distal phalanx of left great toe, initial encounter    ED Discharge Orders    None       Layla Maw 11/07/18 1194    Gareth Morgan, MD 11/08/18 2052

## 2018-11-07 NOTE — ED Triage Notes (Signed)
Patient states that she fell backwards tonight, hurting her left great toe.

## 2019-12-06 ENCOUNTER — Encounter (HOSPITAL_COMMUNITY): Payer: Self-pay | Admitting: *Deleted

## 2019-12-06 ENCOUNTER — Other Ambulatory Visit: Payer: Self-pay

## 2019-12-06 ENCOUNTER — Ambulatory Visit (HOSPITAL_COMMUNITY)
Admission: EM | Admit: 2019-12-06 | Discharge: 2019-12-06 | Disposition: A | Discharge status: Home / Self Care | Payer: Self-pay | Attending: Family Medicine | Admitting: Family Medicine

## 2019-12-06 DIAGNOSIS — N926 Irregular menstruation, unspecified: Secondary | ICD-10-CM

## 2019-12-06 DIAGNOSIS — K0889 Other specified disorders of teeth and supporting structures: Secondary | ICD-10-CM

## 2019-12-06 DIAGNOSIS — Z3202 Encounter for pregnancy test, result negative: Secondary | ICD-10-CM

## 2019-12-06 LAB — POC URINE PREG, ED: Preg Test, Ur: NEGATIVE

## 2019-12-06 MED ORDER — CLINDAMYCIN HCL 150 MG PO CAPS: 150.0000 mg | ORAL_CAPSULE | Freq: Two times a day (BID) | ORAL | 0 refills | Status: DC

## 2019-12-06 MED ORDER — LIDOCAINE VISCOUS HCL 2 % MT SOLN: 15.0000 mL | OROMUCOSAL | 0 refills | Status: AC | PRN

## 2019-12-06 NOTE — ED Triage Notes (Addendum)
Patient in with complaints of a toothache to the left lower side of mouth x 2 days. Ibuprofen taken at home. Swelling noted in left jaw. Patient also requesting pregnancy test due to having nausea and vomiting on Monday. Unknown if pregnant. Implant due to be changed. Patient has had it for 5 years.

## 2019-12-06 NOTE — ED Provider Notes (Signed)
[facility]    CSN: [id number] Arrival date & time: 12/06/19  1152      History   Chief Complaint Chief Complaint  Patient presents with   Dental Pain    HPI Erin Gray is a 24 y.o. female.   Presenting today with 2 days of progressively worsening left lower dental pain that is dull and constant, worst with eating, drinking or talking. Also having some jaw swelling on this side. So far has tried OTC pain relievers without significant relief. Denies fever, chills, body aches, drainage from area. Does not have a dentist in the area.   Also 3 days late for menstrual cycle and having nausea, wanting to be tested for pregnancy. States she has the nexplanon but it's 25 years old. She does not have a GYN locally.      History reviewed. No pertinent past medical history.  There are no problems to display for this patient.   History reviewed. No pertinent surgical history.  OB History   No obstetric history on file.      Home Medications    Prior to Admission medications   Medication Sig Start Date End Date Taking? Authorizing Provider  clindamycin (CLEOCIN) 150 MG capsule Take 1 capsule (150 mg total) by mouth 2 (two) times daily. 12/06/19   Particia Nearing, PA-C  fluconazole (DIFLUCAN) 150 MG tablet Take 1 tablet (150 mg total) by mouth daily. Take second dose 72 hours later if symptoms still persists. 05/24/18   Cathie Hoops, Amy V, PA-C  lidocaine (XYLOCAINE) 2 % solution Use as directed 15 mLs in the mouth or throat as needed for mouth pain. 12/06/19   Particia Nearing, PA-C  metroNIDAZOLE (FLAGYL) 500 MG tablet Take 1 tablet (500 mg total) by mouth 2 (two) times daily. 05/24/18   Belinda Fisher, PA-C    Family History History reviewed. No pertinent family history.  Social History Social History   Tobacco Use   Smoking status: Never Smoker   Smokeless tobacco: Never Used  Building services engineer Use: Every day  Substance Use Topics   Alcohol use: No    Drug use: No     Allergies   Patient has no known allergies.   Review of Systems Review of Systems PER HPI   Physical Exam Triage Vital Signs ED Triage Vitals  Enc Vitals Group     BP 12/06/19 1522 130/79     Pulse Rate 12/06/19 1522 82     Resp 12/06/19 1522 16     Temp 12/06/19 1522 98.5 F (36.9 C)     Temp Source 12/06/19 1522 Oral     SpO2 12/06/19 1522 100 %     Weight --      Height --      Head Circumference --      Peak Flow --      Pain Score 12/06/19 1523 8     Pain Loc --      Pain Edu? --      Excl. in GC? --    No data found.  Updated Vital Signs BP 130/79 (BP Location: Left Arm)    Pulse 82    Temp 98.5 F (36.9 C) (Oral)    Resp 16    SpO2 100%   Visual Acuity Right Eye Distance:   Left Eye Distance:   Bilateral Distance:    Right Eye Near:   Left Eye Near:    Bilateral Near:  Physical Exam Vitals and nursing note reviewed.  Constitutional:      Appearance: Normal appearance. She is not ill-appearing.  HENT:     Head: Atraumatic.     Mouth/Throat:     Mouth: Mucous membranes are moist.     Pharynx: Oropharynx is clear.     Comments: Mild left jaw swelling and ttp Overall good dentition and gingiva appears healthy diffusely aside from left lower jaw posterior to canine tooth some erythema and edema at base of first molar. No active drainage Eyes:     Extraocular Movements: Extraocular movements intact.     Conjunctiva/sclera: Conjunctivae normal.  Cardiovascular:     Rate and Rhythm: Normal rate and regular rhythm.     Heart sounds: Normal heart sounds.  Pulmonary:     Effort: Pulmonary effort is normal.     Breath sounds: Normal breath sounds.  Abdominal:     General: Bowel sounds are normal. There is no distension.     Palpations: Abdomen is soft.     Tenderness: There is no abdominal tenderness. There is no right CVA tenderness, left CVA tenderness or guarding.  Musculoskeletal:        General: Normal range of motion.      Cervical back: Normal range of motion and neck supple.  Lymphadenopathy:     Cervical: No cervical adenopathy.  Skin:    General: Skin is warm and dry.  Neurological:     Mental Status: She is alert and oriented to person, place, and time.  Psychiatric:        Mood and Affect: Mood normal.        Thought Content: Thought content normal.        Judgment: Judgment normal.    UC Treatments / Results  Labs (all labs ordered are listed, but only abnormal results are displayed) Labs Reviewed  POC URINE PREG, ED    EKG   Radiology No results found.  Procedures Procedures (including critical care time)  Medications Ordered in UC Medications - No data to display  Initial Impression / Assessment and Plan / UC Course  I have reviewed the triage vital signs and the nursing notes.  Pertinent labs & imaging results that were available during my care of the patient were reviewed by me and considered in my medical decision making (see chart for details).     Will rx clindamycin, viscous lidocaine for her dental pain and continue OTC pain relievers as needed. Find dentist asap for follow up.   Urine preg neg, information given to patient for local GYN office to follow up for contraception management as her implant is out of date. Discussed using back up methods until able to get this changed out.    Final Clinical Impressions(s) / UC Diagnoses   Final diagnoses:  Pain, dental  Missed period     Discharge Instructions     [facility] [address], [place], [place] [address] Phone: [phone]  It is also important to follow up with a dentist as soon as you are able for your dental pain    ED Prescriptions    Medication Sig Dispense Auth. Provider   clindamycin (CLEOCIN) 150 MG capsule Take 1 capsule (150 mg total) by mouth 2 (two) times daily. 14 capsule Particia Nearing, PA-C   lidocaine (XYLOCAINE) 2 % solution Use as directed 15 mLs in the mouth or  throat as needed for mouth pain. 100 mL Particia Nearing, [place]  PDMP not reviewed this encounter.   Particia Nearing, [place] 12/06/19 [phone]

## 2019-12-06 NOTE — Discharge Instructions (Addendum)
[facility] [address], [place], [place] [address] Phone: [phone]  It is also important to follow up with a dentist as soon as you are able for your dental pain

## 2020-06-19 IMAGING — CR LEFT HAND - COMPLETE 3+ VIEW
3 series · 3 of 3 positions shown · non-contrast
Comparison: None.

CLINICAL DATA: Fall

EXAM:
LEFT HAND - COMPLETE 3+ VIEW

[x hand pa left]
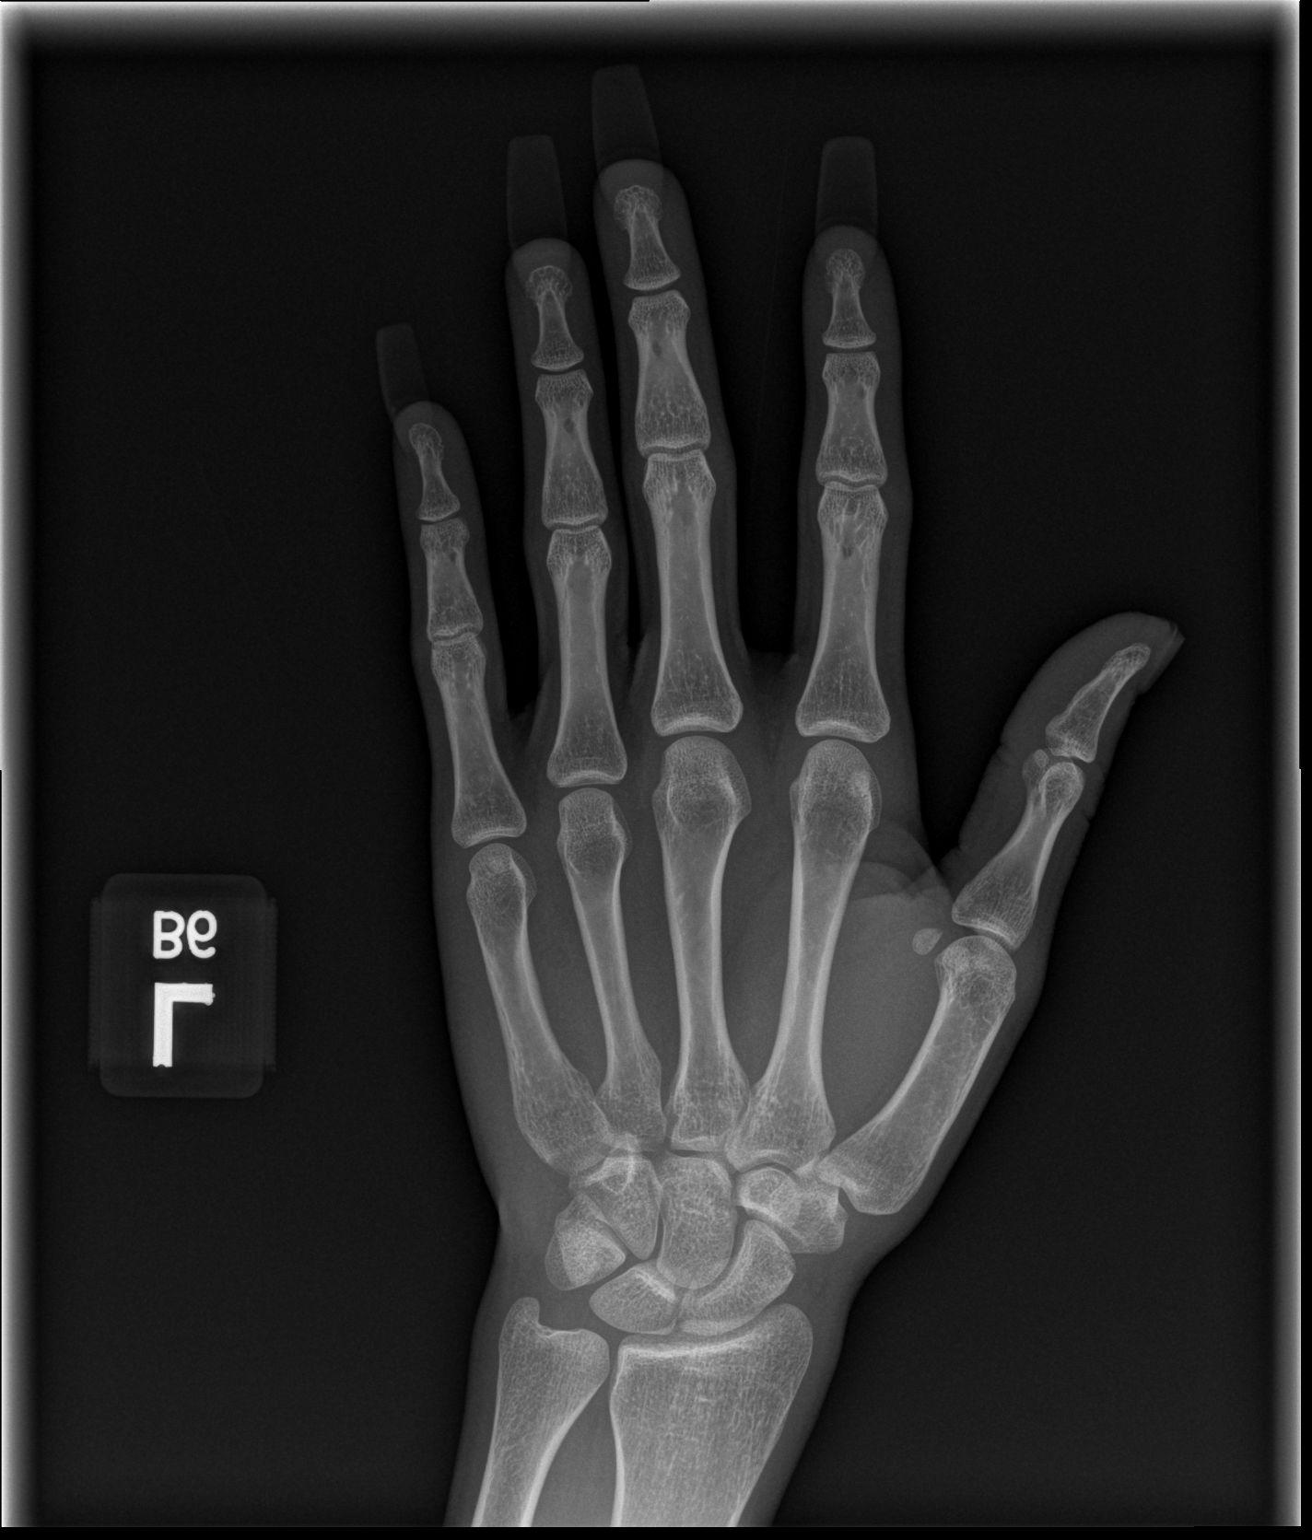

[x hand obl left]
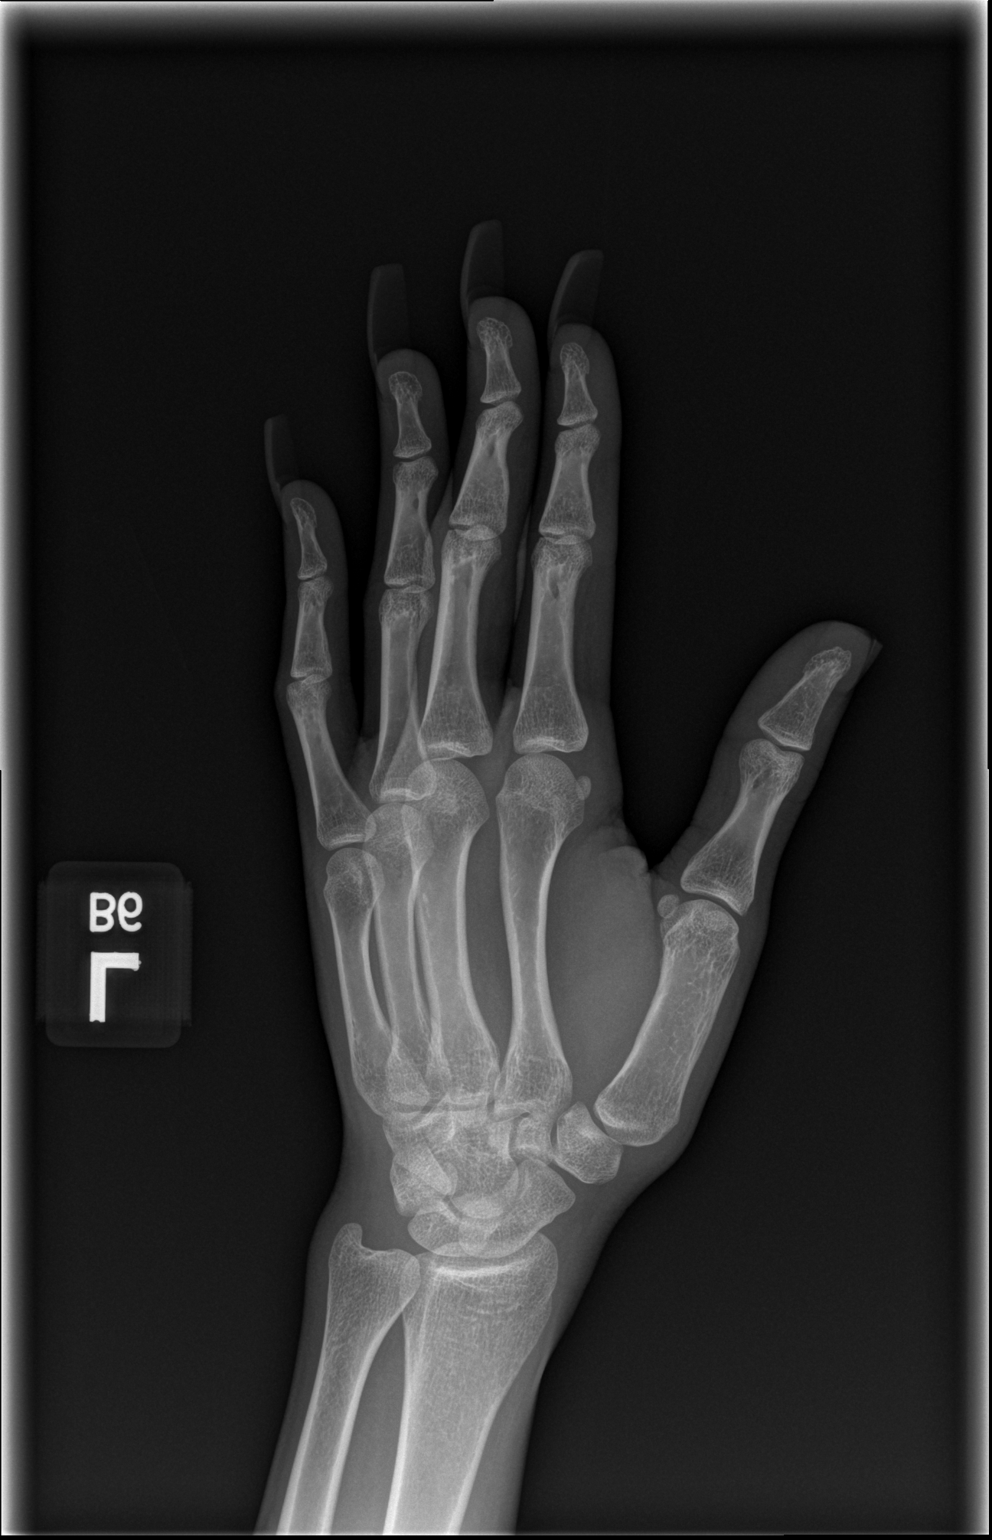

[x hand lat left]
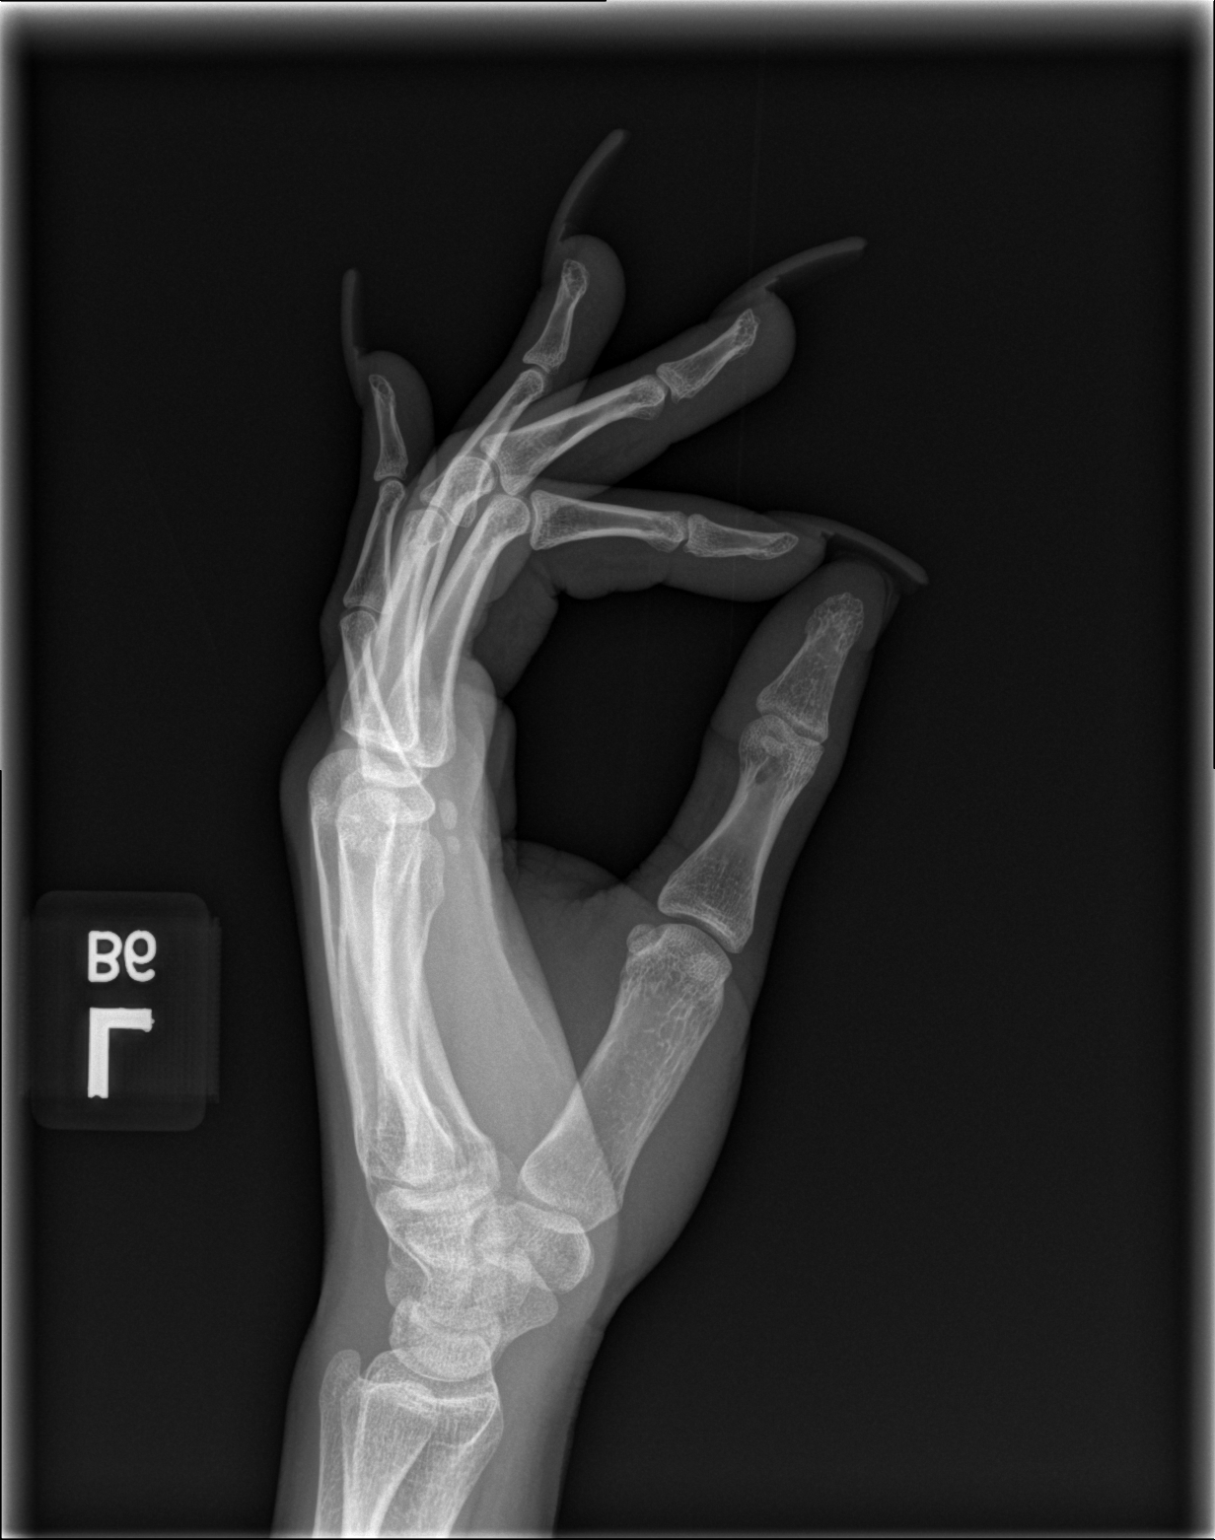

[3 of 3 positions shown; findings below may reference images not displayed]

FINDINGS: There is no evidence of fracture or dislocation. There is no
evidence of arthropathy or other focal bone abnormality. Soft
tissues are unremarkable.
IMPRESSION: Negative.

## 2020-12-17 ENCOUNTER — Ambulatory Visit (HOSPITAL_COMMUNITY)
Admission: EM | Admit: 2020-12-17 | Discharge: 2020-12-17 | Disposition: A | Discharge status: Home / Self Care | Payer: Self-pay | Attending: Family Medicine | Admitting: Family Medicine

## 2020-12-17 ENCOUNTER — Encounter (HOSPITAL_COMMUNITY): Payer: Self-pay

## 2020-12-17 ENCOUNTER — Other Ambulatory Visit: Payer: Self-pay

## 2020-12-17 DIAGNOSIS — N3090 Cystitis, unspecified without hematuria: Secondary | ICD-10-CM | POA: Insufficient documentation

## 2020-12-17 LAB — POCT URINALYSIS DIPSTICK, ED / UC
Bilirubin Urine: NEGATIVE
Glucose, UA: NEGATIVE mg/dL
Ketones, ur: NEGATIVE mg/dL
Nitrite: NEGATIVE
Protein, ur: 100 mg/dL — AB
Specific Gravity, Urine: 1.015 (ref 1.005–1.030)
Urobilinogen, UA: 4 mg/dL — ABNORMAL HIGH (ref 0.0–1.0)
pH: 7 (ref 5.0–8.0)

## 2020-12-17 LAB — POC URINE PREG, ED: Preg Test, Ur: NEGATIVE

## 2020-12-17 MED ORDER — CEPHALEXIN 500 MG PO CAPS: 500.0000 mg | ORAL_CAPSULE | Freq: Two times a day (BID) | ORAL | 0 refills | Status: AC

## 2020-12-17 NOTE — ED Provider Notes (Signed)
  [facility]    ASSESSMENT & PLAN:  1. Cystitis    Begin: Meds ordered this encounter  Medications   cephALEXin (KEFLEX) 500 MG capsule    Sig: Take 1 capsule (500 mg total) by mouth 2 (two) times daily.    Dispense:  10 capsule    Refill:  0   No signs of pyelonephritis. Discussed. Urine culture sent.     Discharge Instructions      You have had labs (urine culture) sent today. We will call you with any significant abnormalities or if there is need to begin or change treatment or pursue further follow up.  You may also review your test results online through MyChart. If you do not have a MyChart account, instructions to sign up should be on your discharge paperwork.     Will follow up with her PCP or here if not showing improvement over the next 48 hours, sooner if needed.  Outlined signs and symptoms indicating need for more acute intervention. Patient verbalized understanding. After Visit Summary given.  SUBJECTIVE:  Erin Gray is a 25 y.o. female who complains of R flank discomfort; x 1-2 d; "feels sore"; dull. Occasional mild dysuria. No vaginal d/c. No gross hematuria. Normal PO intake without n/v/d. Without specific abdominal pain. Ambulatory without difficulty. No tx PTA.  LMP: Patient's last menstrual period was 11/20/2020 (exact date).   OBJECTIVE:  Vitals:   12/17/20 1033  BP: 106/71  Pulse: 98  Resp: 19  Temp: 98.1 F (36.7 C)  TempSrc: Oral  SpO2: 98%   General appearance: alert; no distress HENT: oropharynx: moist Lungs: unlabored respirations Abdomen: soft, non-tender; bowel sounds normal; no masses or organomegaly; no guarding or rebound tenderness Back: no CVA tenderness Extremities: no edema; symmetrical with no gross deformities Skin: warm and dry Neurologic: normal gait Psychological: alert and cooperative; normal mood and affect  Labs Reviewed  POCT URINALYSIS DIPSTICK, ED / UC - Abnormal; Notable for the following  components:      Result Value   Hgb urine dipstick LARGE (*)    Protein, ur 100 (*)    Urobilinogen, UA 4.0 (*)    Leukocytes,Ua MODERATE (*)    All other components within normal limits  URINE CULTURE  POC URINE PREG, ED    No Known Allergies  History reviewed. No pertinent past medical history. Social History   Socioeconomic History   Marital status: Single    Spouse name: Not on file   Number of children: Not on file   Years of education: Not on file   Highest education level: Not on file  Occupational History   Not on file  Tobacco Use   Smoking status: Never   Smokeless tobacco: Never  Vaping Use   Vaping Use: Every day  Substance and Sexual Activity   Alcohol use: No   Drug use: No   Sexual activity: Not Currently    Birth control/protection: Implant  Other Topics Concern   Not on file  Social History Narrative   Not on file   Social Determinants of Health   Financial Resource Strain: Not on file  Food Insecurity: Not on file  Transportation Needs: Not on file  Physical Activity: Not on file  Stress: Not on file  Social Connections: Not on file  Intimate Partner Violence: Not on file   History reviewed. No pertinent family history.      Mardella Layman, MD 12/17/20 1130

## 2020-12-17 NOTE — Discharge Instructions (Addendum)
You have had labs (urine culture) sent today. We will call you with any significant abnormalities or if there is need to begin or change treatment or pursue further follow up.  You may also review your test results online through MyChart. If you do not have a MyChart account, instructions to sign up should be on your discharge paperwork.  

## 2020-12-17 NOTE — ED Triage Notes (Signed)
Pt presents with c/o L side flank pain. States the pain is sharp and achy. Pt states she has had pain when inhaling. States she wants a pregnancy test.

## 2020-12-19 LAB — URINE CULTURE: Culture: >=100000 — AB
